# Patient Record
Sex: Male | Born: 1995 | Race: Black or African American | Hispanic: No | Marital: Single | Smoking: Current some day smoker
Health system: Southern US, Community
[De-identification: ages and names within clinical notes are randomized; demographics above are authoritative.]

## PROBLEM LIST (undated history)

## (undated) DIAGNOSIS — J45909 Unspecified asthma, uncomplicated: Secondary | ICD-10-CM

---
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
## 2015-04-01 ENCOUNTER — Emergency Department (HOSPITAL_COMMUNITY)
Admission: EM | Admit: 2015-04-01 | Discharge: 2015-04-01 | Disposition: A | Discharge status: Home / Self Care | Payer: Medicaid Other | Attending: Emergency Medicine | Admitting: Emergency Medicine

## 2015-04-01 ENCOUNTER — Emergency Department (HOSPITAL_COMMUNITY): Payer: Medicaid Other

## 2015-04-01 ENCOUNTER — Encounter (HOSPITAL_COMMUNITY): Payer: Self-pay | Admitting: Emergency Medicine

## 2015-04-01 DIAGNOSIS — Z791 Long term (current) use of non-steroidal anti-inflammatories (NSAID): Secondary | ICD-10-CM | POA: Insufficient documentation

## 2015-04-01 DIAGNOSIS — J069 Acute upper respiratory infection, unspecified: Secondary | ICD-10-CM | POA: Insufficient documentation

## 2015-04-01 DIAGNOSIS — B9789 Other viral agents as the cause of diseases classified elsewhere: Secondary | ICD-10-CM

## 2015-04-01 LAB — BASIC METABOLIC PANEL
Anion gap: 10 (ref 5–15)
BUN: 18 mg/dL (ref 6–20)
CALCIUM: 9.5 mg/dL (ref 8.9–10.3)
CHLORIDE: 103 mmol/L (ref 101–111)
CO2: 25 mmol/L (ref 22–32)
CREATININE: 1.26 mg/dL — AB (ref 0.61–1.24)
GFR calc non Af Amer: >60 mL/min (ref >60)
Glucose, Bld: 104 mg/dL — ABNORMAL HIGH (ref 65–99)
Potassium: 3.6 mmol/L (ref 3.5–5.1)
SODIUM: 138 mmol/L (ref 135–145)

## 2015-04-01 LAB — CBC
HCT: 47.2 % (ref 39.0–52.0)
Hemoglobin: 16.1 g/dL (ref 13.0–17.0)
MCH: 29.3 pg (ref 26.0–34.0)
MCHC: 34.1 g/dL (ref 30.0–36.0)
MCV: 86 fL (ref 78.0–100.0)
PLATELETS: 290 10*3/uL (ref 150–400)
RBC: 5.49 MIL/uL (ref 4.22–5.81)
RDW: 12.4 % (ref 11.5–15.5)
WBC: 5.8 10*3/uL (ref 4.0–10.5)

## 2015-04-01 LAB — I-STAT TROPONIN, ED: TROPONIN I, POC: 0 ng/mL (ref 0.00–0.08)

## 2015-04-01 MED ORDER — KETOROLAC TROMETHAMINE 60 MG/2ML IM SOLN
60.0000 mg | Freq: Once | INTRAMUSCULAR | Status: AC
  Administered 2015-04-01: 60 mg via INTRAMUSCULAR
  Filled 2015-04-01: qty 2

## 2015-04-01 MED ORDER — DM-GUAIFENESIN ER 30-600 MG PO TB12
1.0000 | ORAL_TABLET | Freq: Two times a day (BID) | ORAL | Status: DC
  Administered 2015-04-01: 1 via ORAL
  Filled 2015-04-01: qty 1

## 2015-04-01 NOTE — ED Notes (Signed)
The patient says he started coughing and is congested and thinks this is why his chest hurts.  The patient said he has taken OTC medications and they have not helped.  The patient rates his pain 8/10.  He is diaphoretic and sob but denies any other symptoms.

## 2015-04-01 NOTE — ED Provider Notes (Signed)
CSN: [id number]     Arrival date & time 04/01/15  0208 History  By signing my name below, I, Bethel Born, attest that this documentation has been prepared under the direction and in the presence of Tomasita Crumble, MD. Electronically Signed: Bethel Born, ED Scribe. 04/01/2015. 4:05 AM  Chief Complaint  Patient presents with  . Chest Pain    The patient says he started coughing and is congested and thinks this is why his chest hurts.  The patient said he has taken OTC medications and they have not helped.      The history is provided by the patient. No language interpreter was used.   Carl Crosby is a 20 y.o. male with history of asthma  who presents to the Emergency Department complaining of new, 8/10 in severity, central chest pain with onset 2 days ago.  The chest pain is exacerbated by coughing. OTC pain medication has provided insufficient relief in pain at home and he did not use his home inhalers.Associated symptoms include cough productive of green sputum, rhinorrhea, SOB, congestion, sore throat, and myalgias. Pt denies fever and known sick contact.   History reviewed. No pertinent past medical history. History reviewed. No pertinent past surgical history. History reviewed. No pertinent family history. Social History  Substance Use Topics  . Smoking status: Never Smoker   . Smokeless tobacco: None  . Alcohol Use: No    Review of Systems  10 Systems reviewed and all are negative for acute change except as noted in the HPI.  Allergies  Review of patient's allergies indicates no known allergies.  Home Medications   Prior to Admission medications   Medication Sig Start Date End Date Taking? Authorizing Provider  meloxicam (MOBIC) 15 MG tablet Take 1 tablet (15 mg total) by mouth daily. 12/24/13   Renne Crigler, PA-C   BP 139/93 mmHg  Pulse 72  Temp(Src) 98.6 F (37 C) (Oral)  Resp 18  Ht  (1.88 m)  Wt 220 lb (99.791 kg)  BMI 28.23 kg/m2  SpO2 97% Physical  Exam  Constitutional: He is oriented to person, place, and time. Vital signs are normal. He appears well-developed and well-nourished.  Non-toxic appearance. He does not appear ill. No distress.  HENT:  Head: Normocephalic and atraumatic.  Nose: Nose normal.  Mouth/Throat: Oropharynx is clear and moist. No oropharyngeal exudate.  Eyes: Conjunctivae and EOM are normal. Pupils are equal, round, and reactive to light. No scleral icterus.  Neck: Normal range of motion. Neck supple. No tracheal deviation, no edema, no erythema and normal range of motion present. No thyroid mass and no thyromegaly present.  Cardiovascular: Normal rate, regular rhythm, S1 normal, S2 normal, normal heart sounds, intact distal pulses and normal pulses.  Exam reveals no gallop and no friction rub.   No murmur heard. Pulmonary/Chest: Effort normal and breath sounds normal. No respiratory distress. He has no wheezes. He has no rhonchi. He has no rales.  Abdominal: Soft. Normal appearance and bowel sounds are normal. He exhibits no distension, no ascites and no mass. There is no hepatosplenomegaly. There is no tenderness. There is no rebound, no guarding and no CVA tenderness.  Musculoskeletal: Normal range of motion. He exhibits no edema or tenderness.  Lymphadenopathy:    He has no cervical adenopathy.  Neurological: He is alert and oriented to person, place, and time. He has normal strength. No cranial nerve deficit or sensory deficit.  Skin: Skin is warm, dry and intact. No petechiae and no rash  noted. He is not diaphoretic. No erythema. No pallor.  Psychiatric: He has a normal mood and affect. His behavior is normal. Judgment normal.  Nursing note and vitals reviewed.   ED Course  Procedures (including critical care time) DIAGNOSTIC STUDIES: Oxygen Saturation is 97% on RA,  normal by my interpretation.    COORDINATION OF CARE: 3:58 AM Discussed treatment plan which includes lab work, CXR, EKG with pt at bedside  and pt agreed to plan.  Labs Review Labs Reviewed  BASIC METABOLIC PANEL - Abnormal; Notable for the following:    Glucose, Bld 104 (*)    Creatinine, Ser 1.26 (*)    All other components within normal limits  CBC  I-STAT TROPOININ, ED    Imaging Review Dg Chest 2 View  04/01/2015  CLINICAL DATA:  Acute onset of generalized chest pain and lower back pain. Cough and congestion. Initial encounter. EXAM: CHEST  2 VIEW COMPARISON:  None. FINDINGS: The lungs are well-aerated. Mild peribronchial thickening is noted. There is no evidence of focal opacification, pleural effusion or pneumothorax. The heart is normal in size; the mediastinal contour is within normal limits. No acute osseous abnormalities are seen. IMPRESSION: Mild peribronchial thickening noted.  Lungs otherwise clear. Electronically Signed   By: Roanna Raider M.D.   On: 04/01/2015 02:46   I have personally reviewed and evaluated these images and lab results as part of my medical decision-making.   EKG Interpretation None      MDM   Final diagnoses:  None   Patient presents emergency department for upper respiratory symptoms. He's had a productive cough. Chest x-ray is negative for pneumonia. Labs are unremarkable. There is likely a viral respiratory infection. He was given Mucinex and Toradol the emergency department. Advise follow-up with primary care physician within 3 days. He appears well in no acute distress, vital signs remain within his normal limits and he is safe for discharge.   I personally performed the services described in this documentation, which was scribed in my presence. The recorded information has been reviewed and is accurate.     Tomasita Crumble, MD 04/01/15 [phone]

## 2015-04-01 NOTE — Discharge Instructions (Signed)
Upper Respiratory Infection, Adult [name], continue to use Mucinex and ibuprofen at home for your symptoms. You need to see a primary care physician within 3 days for close follow-up. If any symptoms worsen, come back to the emergency department immediately. Thank you. Most upper respiratory infections (URIs) are caused by a virus. A URI affects the nose, throat, and upper air passages. The most common type of URI is often called "the common cold." HOME CARE   Take medicines only as told by your doctor.  Gargle warm saltwater or take cough drops to comfort your throat as told by your doctor.  Use a warm mist humidifier or inhale steam from a shower to increase air moisture. This may make it easier to breathe.  Drink enough fluid to keep your pee (urine) clear or pale yellow.  Eat soups and other clear broths.  Have a healthy diet.  Rest as needed.  Go back to work when your fever is gone or your doctor says it is okay.  You may need to stay home longer to avoid giving your URI to others.  You can also wear a face mask and wash your hands often to prevent spread of the virus.  Use your inhaler more if you have asthma.  Do not use any tobacco products, including cigarettes, chewing tobacco, or electronic cigarettes. If you need help quitting, ask your doctor. GET HELP IF:  You are getting worse, not better.  Your symptoms are not helped by medicine.  You have chills.  You are getting more short of breath.  You have brown or red mucus.  You have yellow or brown discharge from your nose.  You have pain in your face, especially when you bend forward.  You have a fever.  You have puffy (swollen) neck glands.  You have pain while swallowing.  You have white areas in the back of your throat. GET HELP RIGHT AWAY IF:   You have very bad or constant:  Headache.  Ear pain.  Pain in your forehead, behind your eyes, and over your cheekbones (sinus pain).  Chest  pain.  You have long-lasting (chronic) lung disease and any of the following:  Wheezing.  Long-lasting cough.  Coughing up blood.  A change in your usual mucus.  You have a stiff neck.  You have changes in your:  Vision.  Hearing.  Thinking.  Mood. MAKE SURE YOU:   Understand these instructions.  Will watch your condition.  Will get help right away if you are not doing well or get worse.   This information is not intended to replace advice given to you by your health care provider. Make sure you discuss any questions you have with your health care provider.   Document Released: 07/26/2007 Document Revised: 06/23/2014 Document Reviewed: 05/14/2013 Elsevier Interactive Patient Education [employer]

## 2015-12-16 ENCOUNTER — Encounter (HOSPITAL_COMMUNITY): Payer: Self-pay

## 2015-12-16 ENCOUNTER — Emergency Department (HOSPITAL_COMMUNITY)
Admission: EM | Admit: 2015-12-16 | Discharge: 2015-12-16 | Disposition: A | Discharge status: Home / Self Care | Payer: Medicaid Other | Attending: Emergency Medicine | Admitting: Emergency Medicine

## 2015-12-16 DIAGNOSIS — Y939 Activity, unspecified: Secondary | ICD-10-CM | POA: Insufficient documentation

## 2015-12-16 DIAGNOSIS — F17200 Nicotine dependence, unspecified, uncomplicated: Secondary | ICD-10-CM | POA: Insufficient documentation

## 2015-12-16 DIAGNOSIS — S199XXA Unspecified injury of neck, initial encounter: Secondary | ICD-10-CM | POA: Insufficient documentation

## 2015-12-16 DIAGNOSIS — J45909 Unspecified asthma, uncomplicated: Secondary | ICD-10-CM | POA: Insufficient documentation

## 2015-12-16 DIAGNOSIS — Y999 Unspecified external cause status: Secondary | ICD-10-CM | POA: Insufficient documentation

## 2015-12-16 DIAGNOSIS — Y92410 Unspecified street and highway as the place of occurrence of the external cause: Secondary | ICD-10-CM | POA: Insufficient documentation

## 2015-12-16 HISTORY — DX: Unspecified asthma, uncomplicated: J45.909

## 2015-12-16 MED ORDER — IBUPROFEN 600 MG PO TABS: 600.0000 mg | ORAL_TABLET | Freq: Four times a day (QID) | ORAL | 0 refills | Status: AC | PRN

## 2015-12-16 MED ORDER — CYCLOBENZAPRINE HCL 10 MG PO TABS: 10.0000 mg | ORAL_TABLET | Freq: Two times a day (BID) | ORAL | 0 refills | Status: AC | PRN
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
## 2015-12-16 NOTE — ED Notes (Signed)
Declined W/C at D/C and was escorted to lobby by RN. 
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
## 2017-10-15 IMAGING — DX DG CHEST 2V
2 series · 2 of 2 positions shown · non-contrast
Comparison: None.

CLINICAL DATA: Acute onset of generalized chest pain and lower back
pain. Cough and congestion. Initial encounter.

EXAM:
CHEST  2 VIEW

[chest pa]
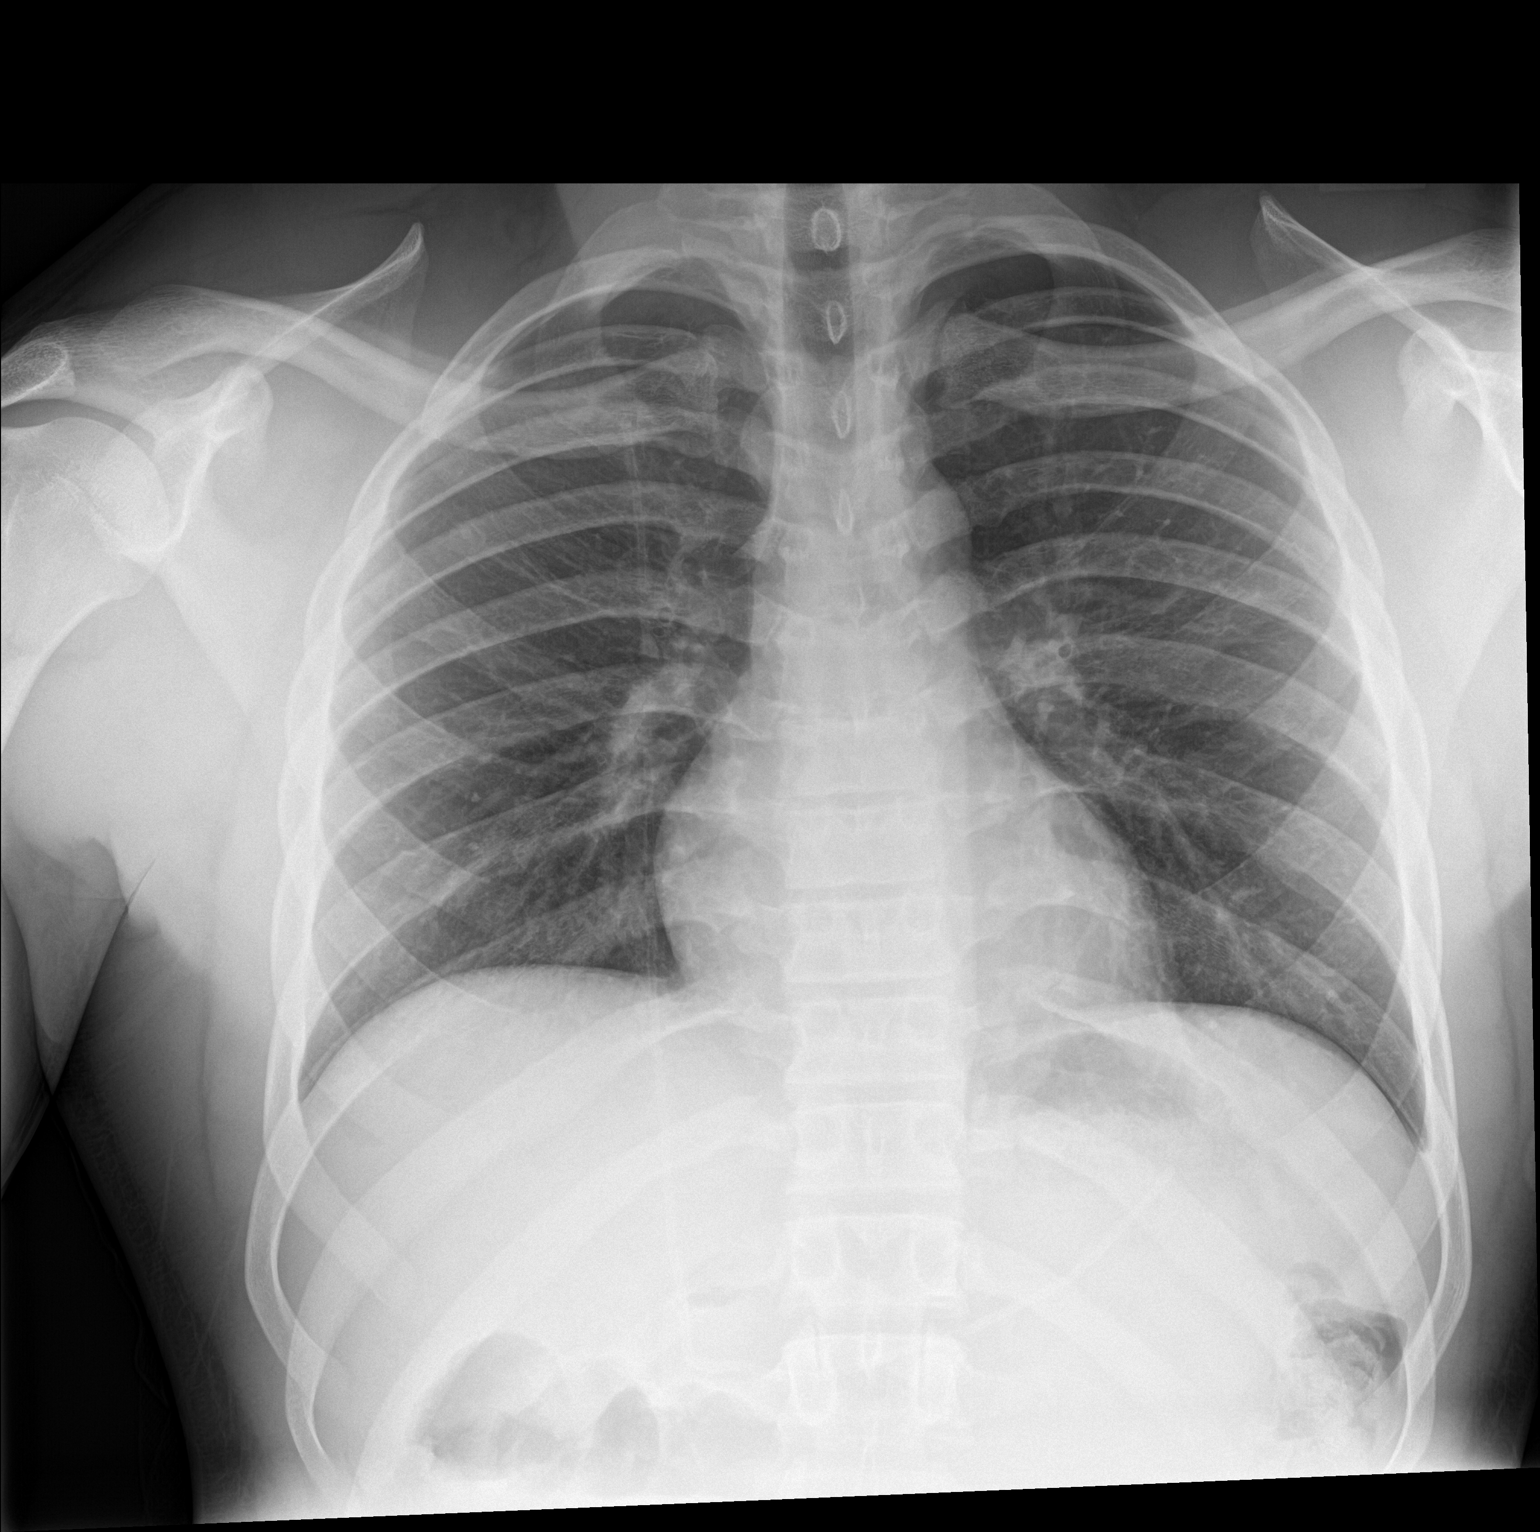

[chest lat]
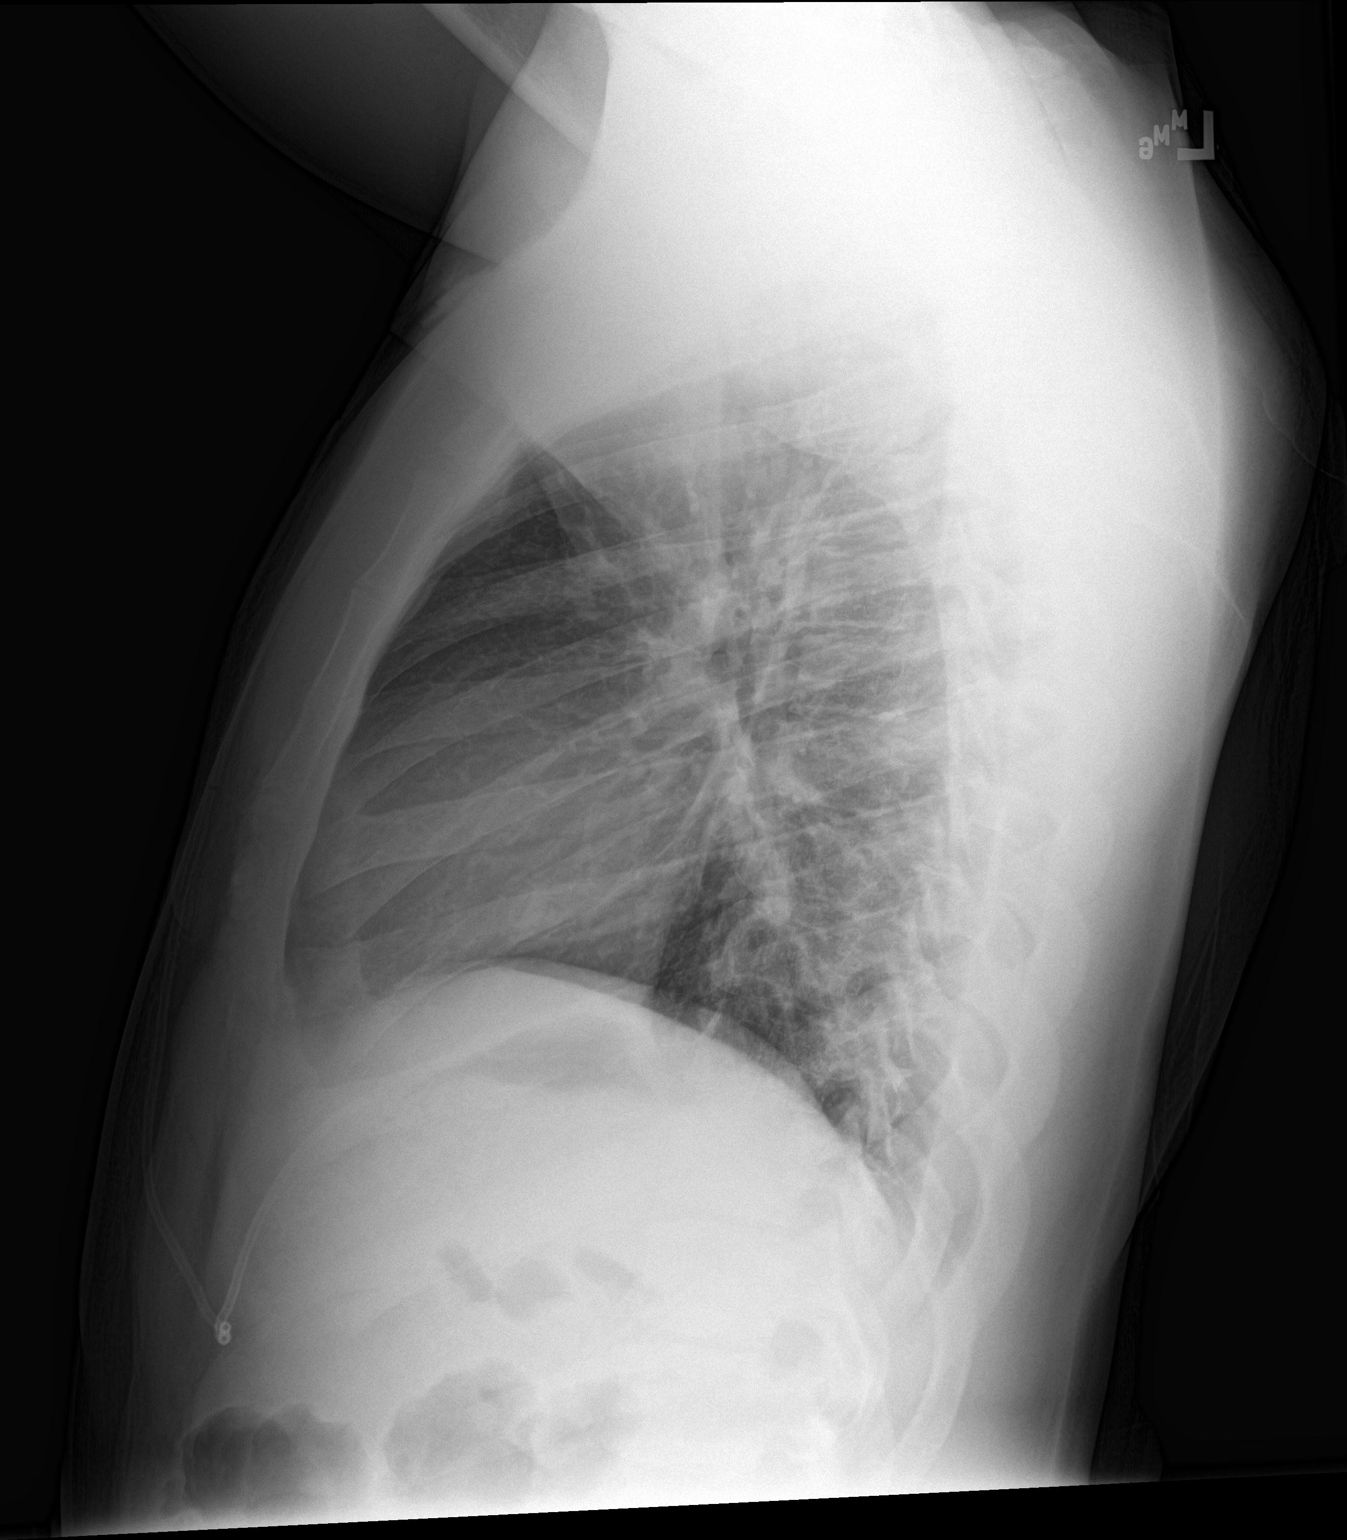

[2 of 2 positions shown; findings below may reference images not displayed]

FINDINGS: The lungs are well-aerated. Mild peribronchial thickening is noted.
There is no evidence of focal opacification, pleural effusion or
pneumothorax.

The heart is normal in size; the mediastinal contour is within
normal limits. No acute osseous abnormalities are seen.
IMPRESSION: Mild peribronchial thickening noted.  Lungs otherwise clear.
